# Patient Record
Sex: Male | Born: 1995 | Hispanic: No | Marital: Single | Smoking: Never smoker
Health system: Southern US, Community
[De-identification: ages and names within clinical notes are randomized; demographics above are authoritative.]

## PROBLEM LIST (undated history)

## (undated) DIAGNOSIS — J309 Allergic rhinitis, unspecified: Secondary | ICD-10-CM

## (undated) DIAGNOSIS — J4520 Mild intermittent asthma, uncomplicated: Secondary | ICD-10-CM

## (undated) DIAGNOSIS — L309 Dermatitis, unspecified: Secondary | ICD-10-CM

## (undated) HISTORY — DX: Mild intermittent asthma, uncomplicated: J45.20

## (undated) HISTORY — DX: Allergic rhinitis, unspecified: J30.9

## (undated) HISTORY — DX: Dermatitis, unspecified: L30.9

---
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
## 2013-12-25 ENCOUNTER — Ambulatory Visit (INDEPENDENT_AMBULATORY_CARE_PROVIDER_SITE_OTHER): Payer: BC Managed Care – PPO | Admitting: Family Medicine

## 2013-12-25 ENCOUNTER — Encounter: Payer: Self-pay | Admitting: Family Medicine

## 2013-12-25 ENCOUNTER — Other Ambulatory Visit: Payer: Self-pay | Admitting: Family Medicine

## 2013-12-25 VITALS — BP 116/75 | HR 62 | Temp 99.0°F | Resp 18 | Ht 73.5 in | Wt 177.0 lb

## 2013-12-25 DIAGNOSIS — R109 Unspecified abdominal pain: Secondary | ICD-10-CM

## 2013-12-25 LAB — PROTIME-INR
INR: 1.2 ratio — AB (ref 0.8–1.0)
PROTHROMBIN TIME: 12.3 s (ref 10.2–12.4)

## 2013-12-25 LAB — COMPREHENSIVE METABOLIC PANEL
ALT: 15 U/L (ref 0–53)
AST: 17 U/L (ref 0–37)
Albumin: 4.3 g/dL (ref 3.5–5.2)
Alkaline Phosphatase: 60 U/L (ref 39–117)
BUN: 15 mg/dL (ref 6–23)
CALCIUM: 9.6 mg/dL (ref 8.4–10.5)
CO2: 32 mEq/L (ref 19–32)
Chloride: 101 mEq/L (ref 96–112)
Creatinine, Ser: 0.9 mg/dL (ref 0.4–1.5)
GFR: 111.25 mL/min (ref >60.00)
Glucose, Bld: 89 mg/dL (ref 70–99)
Potassium: 4.5 mEq/L (ref 3.5–5.1)
SODIUM: 137 meq/L (ref 135–145)
TOTAL PROTEIN: 6.9 g/dL (ref 6.0–8.3)
Total Bilirubin: 0.9 mg/dL (ref 0.3–1.2)

## 2013-12-25 LAB — CBC WITH DIFFERENTIAL/PLATELET
BASOS PCT: 1.2 % (ref 0.0–3.0)
Basophils Absolute: 0.1 10*3/uL (ref 0.0–0.1)
Eosinophils Absolute: 1.3 10*3/uL — ABNORMAL HIGH (ref 0.0–0.7)
HCT: 46.8 % (ref 39.0–52.0)
Hemoglobin: 15.9 g/dL (ref 13.0–17.0)
LYMPHS PCT: 29.4 % (ref 12.0–46.0)
Lymphs Abs: 1.6 10*3/uL (ref 0.7–4.0)
MCHC: 34 g/dL (ref 30.0–36.0)
MCV: 88.7 fl (ref 78.0–100.0)
MONOS PCT: 5.8 % (ref 3.0–12.0)
Monocytes Absolute: 0.3 10*3/uL (ref 0.1–1.0)
Neutro Abs: 2.1 10*3/uL (ref 1.4–7.7)
Neutrophils Relative %: 38.6 % — ABNORMAL LOW (ref 43.0–77.0)
Platelets: 211 10*3/uL (ref 150.0–400.0)
RBC: 5.28 Mil/uL (ref 4.22–5.81)
RDW: 12.9 % (ref 11.5–14.6)
WBC: 5.3 10*3/uL (ref 4.5–10.5)

## 2013-12-25 LAB — SEDIMENTATION RATE: SED RATE: 2 mm/h (ref 0–22)

## 2013-12-25 LAB — LIPASE: Lipase: 20 U/L (ref 11.0–59.0)

## 2013-12-25 LAB — IGA: IgA: 162 mg/dL (ref 68–378)

## 2013-12-25 NOTE — Progress Notes (Signed)
Office Note 12/29/2013  CC:  Chief Complaint  Patient presents with  . Establish Care  . Bloated    after eating certain foods, mainly after milk.    HPI:  Marc Snyder is a 18 y.o. [place] and AA  male who is here to establish care and discuss GI complaints. Patient's most recent primary MD: "[place]where on [place]" Old records were not reviewed prior to or during today's visit.  Pt describes an itchy/bumpy rash about 8 mo ago, all over, lasted a couple of months.  Saw dermatologist and was rx'd med for scabies and this didn't help, then triamcinolone cream and this seemed to helped (derm in [place]). Then a couple of months later he started getting GI problems: initially started with bloating when he ate dairy products, usually 20 min after, led to vomiting and this relieved the pain.  Then same sx's started happening with pastas, [place] foods, and now he cannot say there is any correlation with any one specific food or food category.  Says about 1/2 the time he eats he has these postprandial bloating, then pain, and then nausea/vomiting sx's.  Very typical progression each time.  He has no sx's unless he eats. Bowels largely unchanged except sometimes "soft" stool but no watery stool.  History reviewed. No pertinent past medical history.  History reviewed. No pertinent past surgical history.  History reviewed. No pertinent family history.  History   Social History  . Marital Status: Single    Spouse Name: N/A    Number of Children: N/A  . Years of Education: N/A   Occupational History  . Not on file.   Social History Main Topics  . Smoking status: Never Smoker   . Smokeless tobacco: Never Used  . Alcohol Use: No  . Drug Use: No  . Sexual Activity: Not on file   Other Topics Concern  . Not on file   Social History Narrative   Single.   Student at [employer].   Orig from [place], [place].   No T/A/Ds.    Outpatient Encounter Prescriptions as of  12/25/2013  Medication Sig  . cetirizine (ZYRTEC) 10 MG tablet Take 10 mg by mouth daily.  . hydrOXYzine (ATARAX/VISTARIL) 10 MG tablet Take 10 mg by mouth daily.  Marland Kitchen triamcinolone ointment (KENALOG) 0.1 % Apply 1 application topically 2 (two) times daily.    No Known Allergies  ROS Review of Systems  Constitutional: Negative for fever, chills, appetite change and fatigue.  HENT: Negative for congestion, dental problem, ear pain and sore throat.   Eyes: Negative for discharge, redness and visual disturbance.  Respiratory: Negative for cough, chest tightness, shortness of breath and wheezing.   Cardiovascular: Negative for chest pain, palpitations and leg swelling.  Gastrointestinal: Negative for nausea, vomiting, diarrhea and blood in stool.  Genitourinary: Negative for dysuria, urgency, frequency, hematuria, flank pain and difficulty urinating.  Musculoskeletal: Negative for arthralgias, back pain, joint swelling, myalgias and neck stiffness.  Skin: Negative for pallor. Rash: see hpi.  Neurological: Negative for dizziness, speech difficulty, weakness and headaches.  Hematological: Negative for adenopathy. Does not bruise/bleed easily.  Psychiatric/Behavioral: Negative for confusion and sleep disturbance. The patient is not nervous/anxious.     PE; Blood pressure 116/75, pulse 62, temperature 99 F (37.2 C), temperature source Temporal, resp. rate 18, height 6' 1.5" (1.867 m), weight 177 lb (80.287 kg), SpO2 99.00%. Gen: Alert, well appearing.  Patient is oriented to person, place, time, and situation. AFFECT: pleasant, lucid thought and  speech. ENT: Ears: EACs clear, normal epithelium.  TMs with good light reflex and landmarks bilaterally.  Eyes: no injection, icteris, swelling, or exudate.  EOMI, PERRLA. Nose: no drainage or turbinate edema/swelling.  No injection or focal lesion.  Mouth: lips without lesion/swelling.  Oral mucosa pink and moist.  Dentition intact and without obvious  caries or gingival swelling.  Oropharynx without erythema, exudate, or swelling.  Neck: supple/nontender.  No LAD, mass, or TM.   CV: RRR, no m/r/g.   LUNGS: CTA bilat, nonlabored resps, good aeration in all lung fields. ABD: soft, NT, ND, BS normal.  No hepatospenomegaly or mass.  No bruits. EXT: no clubbing, cyanosis, or edema.  Musculoskeletal: no joint swelling, erythema, warmth, or tenderness.  ROM of all joints intact. Skin - brownish tint to skin, with scattered hyperpigmented macules from 7m to 3-4 mm in size on arms, legs, trunk.  No active inflammatory skin lesions, no scabs or excoriations.   Pertinent labs:  None today  ASSESSMENT AND PLAN:   Recurrent abdominal pain Unclear etiology: ? Gastritis, celiac dz, lactos intolerance, symptomatic gallstones? Will check complete abd u/s and CBC, CMET, ESR, TTG IgA, IgA level, lipase, PT/INR, and vit D level. No meds rx'd today.   An After Visit Summary was printed and given to the patient.  Spent 30 min with pt today, with >50% of this time spent in counseling and care coordination regarding the above problems.  Return for o/v in 7-10d f/u GI issues.

## 2013-12-25 NOTE — Progress Notes (Signed)
Pre visit review using our clinic review tool, if applicable. No additional management support is needed unless otherwise documented below in the visit note. 

## 2013-12-26 ENCOUNTER — Ambulatory Visit (HOSPITAL_BASED_OUTPATIENT_CLINIC_OR_DEPARTMENT_OTHER)
Admission: RE | Admit: 2013-12-26 | Discharge: 2013-12-26 | Disposition: A | Discharge status: Home / Self Care | Payer: BC Managed Care – PPO | Source: Ambulatory Visit | Attending: Family Medicine | Admitting: Family Medicine

## 2013-12-26 DIAGNOSIS — R109 Unspecified abdominal pain: Secondary | ICD-10-CM | POA: Insufficient documentation

## 2013-12-26 LAB — TISSUE TRANSGLUTAMINASE, IGA: Tissue Transglutaminase Ab, IgA: 2.9 U/mL (ref <20)

## 2013-12-26 LAB — VITAMIN D 25 HYDROXY (VIT D DEFICIENCY, FRACTURES): VIT D 25 HYDROXY: 24 ng/mL — AB (ref 30–89)

## 2013-12-29 ENCOUNTER — Encounter: Payer: Self-pay | Admitting: Family Medicine

## 2013-12-29 DIAGNOSIS — R109 Unspecified abdominal pain: Secondary | ICD-10-CM | POA: Insufficient documentation

## 2013-12-29 NOTE — Assessment & Plan Note (Addendum)
Unclear etiology: ? Gastritis, celiac dz, lactos intolerance, symptomatic gallstones? Will check complete abd u/s and CBC, CMET, ESR, TTG IgA, IgA level, lipase, PT/INR, and vit D level. No meds rx'd today.

## 2013-12-30 ENCOUNTER — Other Ambulatory Visit: Payer: BC Managed Care – PPO

## 2013-12-31 LAB — HELICOBACTER PYLORI ABS-IGG+IGA, BLD: HELICOBACTER PYLORI AB, IGA: 1.2 U/mL (ref <9.0)

## 2014-01-02 ENCOUNTER — Encounter: Payer: Self-pay | Admitting: Family Medicine

## 2014-01-02 ENCOUNTER — Ambulatory Visit (INDEPENDENT_AMBULATORY_CARE_PROVIDER_SITE_OTHER): Payer: BC Managed Care – PPO | Admitting: Family Medicine

## 2014-01-02 VITALS — BP 122/77 | HR 68 | Temp 99.8°F | Resp 18 | Ht 73.5 in | Wt 178.0 lb

## 2014-01-02 DIAGNOSIS — R1084 Generalized abdominal pain: Secondary | ICD-10-CM

## 2014-01-02 DIAGNOSIS — R109 Unspecified abdominal pain: Secondary | ICD-10-CM

## 2014-01-02 DIAGNOSIS — D721 Eosinophilia, unspecified: Secondary | ICD-10-CM

## 2014-01-02 LAB — CBC WITH DIFFERENTIAL/PLATELET
BASOS PCT: 1 % (ref 0–1)
Basophils Absolute: 0.1 10*3/uL (ref 0.0–0.1)
EOS ABS: 0.9 10*3/uL — AB (ref 0.0–0.7)
Eosinophils Relative: 14 % — ABNORMAL HIGH (ref 0–5)
HEMATOCRIT: 43.6 % (ref 39.0–52.0)
Hemoglobin: 15 g/dL (ref 13.0–17.0)
Lymphocytes Relative: 24 % (ref 12–46)
Lymphs Abs: 1.6 10*3/uL (ref 0.7–4.0)
MCH: 29.7 pg (ref 26.0–34.0)
MCHC: 34.4 g/dL (ref 30.0–36.0)
MCV: 86.3 fL (ref 78.0–100.0)
Monocytes Absolute: 0.3 10*3/uL (ref 0.1–1.0)
Monocytes Relative: 5 % (ref 3–12)
NEUTROS ABS: 3.7 10*3/uL (ref 1.7–7.7)
NEUTROS PCT: 56 % (ref 43–77)
PLATELETS: 235 10*3/uL (ref 150–400)
RBC: 5.05 MIL/uL (ref 4.22–5.81)
RDW: 13.7 % (ref 11.5–15.5)
WBC: 6.6 10*3/uL (ref 4.0–10.5)

## 2014-01-02 NOTE — Progress Notes (Signed)
OFFICE NOTE  01/02/2014  CC:  Chief Complaint  Patient presents with  . Follow-up  . Results     HPI: Patient is a 18 y.o. mixed male who is here for 1 wk f/u for recurrent postprandial abd pains. No change in sx's. Reviewed all recent labs (the only abnormalities being mildly low vit D level and also a markedly elevated eosinophil count). Abd u/s was normal.  He reports having extensive allergy testing via [facility] all/immun about 2 mo after his sx's began (when it was occurring with dairy products only).  Pertinent PMH:  Past medical, surgical, social, and family history reviewed and no changes are noted since last office visit.  MEDS:  Outpatient Prescriptions Prior to Visit  Medication Sig Dispense Refill  . cetirizine (ZYRTEC) 10 MG tablet Take 10 mg by mouth daily.      . hydrOXYzine (ATARAX/VISTARIL) 10 MG tablet Take 10 mg by mouth daily.      Marland Kitchen. triamcinolone ointment (KENALOG) 0.1 % Apply 1 application topically 2 (two) times daily.       No facility-administered medications prior to visit.    PE: Blood pressure 122/77, pulse 68, temperature 99.8 F (37.7 C), temperature source Temporal, resp. rate 18, height 6' 1.5" (1.867 m), weight 178 lb (80.74 kg), SpO2 98.00%. Gen: Alert, well appearing.  Patient is oriented to person, place, time, and situation. No further exam today.  IMPRESSION AND PLAN:  Recurrent postprandial abd pain: initially started out as dairy/lactose intolerance but has since progressed to involving any type of food. With recent eosinophilia, will repeat the CBC today and add IgE level.  Discussed possible dx of parasitic infection as cause of all this, but we all agreed a GI specialist's input is needed so I referred him to GI today.  An After Visit Summary was printed and given to the patient.  FOLLOW UP: prn

## 2014-01-02 NOTE — Progress Notes (Signed)
Pre visit review using our clinic review tool, if applicable. No additional management support is needed unless otherwise documented below in the visit note. 

## 2014-01-03 LAB — IGE: IgE (Immunoglobulin E), Serum: 78 IU/mL (ref 0.0–180.0)

## 2014-01-05 ENCOUNTER — Encounter: Payer: Self-pay | Admitting: Gastroenterology

## 2014-01-13 ENCOUNTER — Encounter: Payer: Self-pay | Admitting: Gastroenterology

## 2014-01-13 ENCOUNTER — Ambulatory Visit (INDEPENDENT_AMBULATORY_CARE_PROVIDER_SITE_OTHER): Payer: BC Managed Care – PPO | Admitting: Gastroenterology

## 2014-01-13 VITALS — BP 100/60 | HR 64 | Ht 73.0 in | Wt 180.4 lb

## 2014-01-13 DIAGNOSIS — R109 Unspecified abdominal pain: Secondary | ICD-10-CM

## 2014-01-13 MED ORDER — METRONIDAZOLE 250 MG PO TABS: 250.0000 mg | ORAL_TABLET | Freq: Three times a day (TID) | ORAL | Status: DC

## 2014-01-13 NOTE — Progress Notes (Signed)
    _                                                                                                                History of Present Illness: This 18 year old male referred for evaluation of abdominal pain.  Over the past year he's experienced multiple episodes of postprandial abdominal distention followed by pain and occasional vomiting.  Vomiting relieves his symptoms.  This may happen up to several times a week.  He was tested for food allergies and eliminated milk from his diet without relief.  Symptoms seem to be worsening.  Weight is stable.  Lab work was pertinent for normal white count but an absolute eosinophilia.  Stools are occasionally soft.  There is no history of melena or hematochezia.  He's had no travel in the past year.    History reviewed. No pertinent past medical history. History reviewed. No pertinent past surgical history. family history is not on file. Current Outpatient Prescriptions  Medication Sig Dispense Refill  . cetirizine (ZYRTEC) 10 MG tablet Take 10 mg by mouth daily.      . cholecalciferol (VITAMIN D) 1000 UNITS tablet Take 1,000 Units by mouth daily.      . hydrOXYzine (ATARAX/VISTARIL) 10 MG tablet Take 10 mg by mouth daily.      Marland Kitchen. triamcinolone ointment (KENALOG) 0.1 % Apply 1 application topically 2 (two) times daily.       No current facility-administered medications for this visit.   Allergies as of 01/13/2014  . (No Known Allergies)    reports that he has never smoked. He has never used smokeless tobacco. He reports that he does not drink alcohol or use illicit drugs.     Review of Systems: Pertinent positive and negative review of systems were noted in the above HPI section. All other review of systems were otherwise negative.  Vital signs were reviewed in today's medical record Physical Exam: General: Well developed , well nourished, no acute distress Skin: anicteric Head: Normocephalic and atraumatic Eyes:  sclerae  anicteric, EOMI Ears: Normal auditory acuity Mouth: No deformity or lesions Neck: Supple, no masses or thyromegaly Lungs: Clear throughout to auscultation Heart: Regular rate and rhythm; no murmurs, rubs or bruits Abdomen: Soft, non tender and non distended. No masses, hepatosplenomegaly or hernias noted. Normal Bowel sounds Rectal:deferred Musculoskeletal: Symmetrical with no gross deformities  Skin: No lesions on visible extremities Pulses:  Normal pulses noted Extremities: No clubbing, cyanosis, edema or deformities noted Neurological: Alert oriented x 4, grossly nonfocal Cervical Nodes:  No significant cervical adenopathy Inguinal Nodes: No significant inguinal adenopathy Psychological:  Alert and cooperative. Normal mood and affect  See Assessment and Plan under Problem List

## 2014-01-13 NOTE — Patient Instructions (Signed)
Follow up in one month Medication will be at your pharmacy

## 2014-01-13 NOTE — Assessment & Plan Note (Addendum)
One year history of postprandial abdominal pain, bloating, nausea with relief by vomiting.  Lab work is pertinent for an eosinophilia.  This raises the question of a parasitic infection such as Giardia although this would represent a very prolonged infection.  He has no obvious exposure including well water and no recent travel.  He has undergone allergy testing.    Recommendations #1 empiric trial of Flagyl 250 mg 3 times a day for 7 days

## 2014-02-05 ENCOUNTER — Ambulatory Visit: Payer: BC Managed Care – PPO | Admitting: Nurse Practitioner

## 2014-02-06 ENCOUNTER — Telehealth: Payer: Self-pay | Admitting: Gastroenterology

## 2014-02-06 MED ORDER — NYSTATIN 100000 UNIT/ML MT SUSP: OROMUCOSAL | Status: DC

## 2014-02-06 NOTE — Telephone Encounter (Signed)
Do you want the oral solution or tablets? Swish or swallow or swish and spit. For how long? Please, advise.

## 2014-02-06 NOTE — Telephone Encounter (Signed)
Nystatin 100,000 units per cc 5 cc 4 times a day for 7 days.  Swish and spit out

## 2014-02-06 NOTE — Telephone Encounter (Signed)
Okay for nystatin ? ?

## 2014-02-06 NOTE — Telephone Encounter (Signed)
Patient has developed white spots on his tongue from the antibiotic he was taking. Patient is requesting an Rx Nystatin. He states his mother used this for thrush when he was a child. [facility]

## 2014-02-06 NOTE — Telephone Encounter (Signed)
Rx sent .Left a message for patient that rx has been sent. 

## 2014-02-06 NOTE — Telephone Encounter (Signed)
Patient took Flagyl. He now has a sore tongue and white coating on tongue. He is asking for Nystatin. Please, advise.

## 2014-02-19 ENCOUNTER — Encounter: Payer: Self-pay | Admitting: Family Medicine

## 2014-03-09 ENCOUNTER — Ambulatory Visit: Payer: BC Managed Care – PPO | Admitting: Gastroenterology

## 2014-03-10 ENCOUNTER — Encounter: Payer: Self-pay | Admitting: Family Medicine

## 2014-05-15 ENCOUNTER — Encounter: Payer: Self-pay | Admitting: Gastroenterology

## 2014-05-15 ENCOUNTER — Ambulatory Visit (INDEPENDENT_AMBULATORY_CARE_PROVIDER_SITE_OTHER): Payer: BC Managed Care – PPO | Admitting: Gastroenterology

## 2014-05-15 VITALS — BP 100/58 | HR 76 | Ht 73.0 in | Wt 182.5 lb

## 2014-05-15 DIAGNOSIS — R109 Unspecified abdominal pain: Secondary | ICD-10-CM

## 2014-05-15 NOTE — Patient Instructions (Signed)
Follow up as needed

## 2014-05-15 NOTE — Progress Notes (Signed)
      History of Present Illness:  [name] has returned for followup of abdominal pain.  He was empirically treated with Flagyl reports complete resolution of his abdominal complaints.  He currently has no GI issues.    Review of Systems: Pertinent positive and negative review of systems were noted in the above HPI section. All other review of systems were otherwise negative.    Current Medications, Allergies, Past Medical History, Past Surgical History, Family History and Social History were reviewed in [facility] electronic medical record  Vital signs were reviewed in today's medical record. Physical Exam: General: Well developed , well nourished, no acute distress   See Assessment and Plan under Problem List

## 2014-05-15 NOTE — Assessment & Plan Note (Signed)
One year history of postprandial abdominal pain, bloating, nausea with relief by vomiting.  Symptoms resolved with empiric therapy with Flagyl.
